# Patient Record
Sex: Female | Born: 1990 | Race: White | Hispanic: No | Marital: Single | Smoking: Never smoker
Health system: Southern US, Community
[De-identification: ages and names within clinical notes are randomized; demographics above are authoritative.]

## PROBLEM LIST (undated history)

## (undated) DIAGNOSIS — F419 Anxiety disorder, unspecified: Secondary | ICD-10-CM

## (undated) DIAGNOSIS — G43909 Migraine, unspecified, not intractable, without status migrainosus: Secondary | ICD-10-CM

## (undated) HISTORY — DX: Anxiety disorder, unspecified: F41.9

## (undated) HISTORY — DX: Migraine, unspecified, not intractable, without status migrainosus: G43.909

---

## 2011-08-14 ENCOUNTER — Emergency Department (HOSPITAL_COMMUNITY)
Admission: EM | Admit: 2011-08-14 | Discharge: 2011-08-14 | Disposition: A | Discharge status: Home / Self Care | Payer: 59 | Attending: Emergency Medicine | Admitting: Emergency Medicine

## 2011-08-14 ENCOUNTER — Encounter (HOSPITAL_COMMUNITY): Payer: Self-pay | Admitting: *Deleted

## 2011-08-14 DIAGNOSIS — H9201 Otalgia, right ear: Secondary | ICD-10-CM

## 2011-08-14 DIAGNOSIS — H6091 Unspecified otitis externa, right ear: Secondary | ICD-10-CM

## 2011-08-14 DIAGNOSIS — J029 Acute pharyngitis, unspecified: Secondary | ICD-10-CM

## 2011-08-14 DIAGNOSIS — R070 Pain in throat: Secondary | ICD-10-CM | POA: Insufficient documentation

## 2011-08-14 DIAGNOSIS — H9209 Otalgia, unspecified ear: Secondary | ICD-10-CM | POA: Insufficient documentation

## 2011-08-14 LAB — RAPID STREP SCREEN (MED CTR MEBANE ONLY): Streptococcus, Group A Screen (Direct): NEGATIVE

## 2011-08-14 MED ORDER — NEOMYCIN-POLYMYXIN-HC 3.5-10000-1 OT SOLN
4.0000 [drp] | OTIC | Status: AC
  Administered 2011-08-14: 4 [drp] via OTIC
  Filled 2011-08-14: qty 10

## 2011-08-14 MED ORDER — NAPROXEN 250 MG PO TABS: 250.0000 mg | ORAL_TABLET | Freq: Two times a day (BID) | ORAL | Status: DC

## 2011-08-14 MED ORDER — OXYCODONE-ACETAMINOPHEN 5-325 MG PO TABS
2.0000 | ORAL_TABLET | Freq: Once | ORAL | Status: AC
  Administered 2011-08-14: 2 via ORAL
  Filled 2011-08-14 (×2): qty 2

## 2011-08-14 MED ORDER — NEOMYCIN-POLYMYXIN-HC 3.5-10000-1 OT SUSP
OTIC | Status: AC
  Filled 2011-08-14: qty 10

## 2011-08-14 MED ORDER — NEOMYCIN-COLIST-HC-THONZONIUM 3.3-3-10-0.5 MG/ML OT SUSP: 4.0000 [drp] | Freq: Once | OTIC | Status: DC

## 2011-08-14 MED ORDER — OXYCODONE-ACETAMINOPHEN 5-325 MG PO TABS: ORAL_TABLET | ORAL | Status: AC

## 2011-08-14 NOTE — ED Provider Notes (Signed)
History     CSN: [id number]  Arrival date & time 08/14/11  0150   First MD Initiated Contact with Patient 08/14/11 0220      Chief Complaint  Patient presents with  . Otalgia  . Sore Throat    HPI Pt was seen at 0225.  Per pt, c/o gradual onset and persistence of constant sore throat x3 days, and right ear pain that began PTA.  Denies fevers, no cough, no SOB, no rash, no abd pain, no injury.   History reviewed. No pertinent past medical history.  History reviewed. No pertinent past surgical history.   History  Substance Use Topics  . Smoking status: Not on file  . Smokeless tobacco: Not on file  . Alcohol Use: No   Review of Systems ROS: Statement: All systems negative except as marked or noted in the HPI; Constitutional: Negative for fever and chills. ; ; Eyes: Negative for eye pain, redness and discharge. ; ; ENMT: Negative for hoarseness, nasal congestion, sinus pressure; +right ear pain, sore throat. ; ; Cardiovascular: Negative for chest pain, palpitations, diaphoresis, dyspnea and peripheral edema. ; ; Respiratory: Negative for cough, wheezing and stridor. ; ; Gastrointestinal: Negative for nausea, vomiting, diarrhea, abdominal pain, blood in stool, hematemesis, jaundice and rectal bleeding. . ; ; Genitourinary: Negative for dysuria, flank pain and hematuria. ; ; Musculoskeletal: Negative for back pain and neck pain. Negative for swelling and trauma.; ; Skin: Negative for pruritus, rash, abrasions, blisters, bruising and skin lesion.; ; Neuro: Negative for headache, lightheadedness and neck stiffness. Negative for weakness, altered level of consciousness , altered mental status, extremity weakness, paresthesias, involuntary movement, seizure and syncope.     Allergies  Erythromycin  Home Medications  No current outpatient prescriptions on file.  BP 134/79  Pulse 108  Temp(Src) 98.8 F (37.1 C) (Oral)  Resp 20  SpO2 99%  Physical Exam 0230: Physical examination:   Nursing notes reviewed; Vital signs and O2 SAT reviewed;  Constitutional: Well developed, Well nourished, Well hydrated, In no acute distress; Head:  Normocephalic, atraumatic; Eyes: EOMI, PERRL, No scleral icterus; ENMT: TM's clear bilat. +right external auditory canal with mild edema but no erythema, drainage, or wounds; +edemetous nasal turbinates bilat with clear rhinorrhea; Mouth and pharynx normal, Mucous membranes moist, no hoarse voice, no drooling, no stridor, speaking full sentences with ease; Neck: Supple, Full range of motion, No lymphadenopathy; Cardiovascular: Regular rate and rhythm, No murmur, rub, or gallop; Respiratory: Breath sounds clear & equal bilaterally, No rales, rhonchi, wheezes, or rub, Normal respiratory effort/excursion; Chest: Nontender, Movement normal; Extremities: Pulses normal, No tenderness, No edema, No calf edema or asymmetry.; Neuro: AA&Ox3, Major CN grossly intact.  No gross focal motor or sensory deficits in extremities.; Skin: Color normal, Warm, Dry, no rash.    ED Course  Procedures    MDM  MDM Reviewed: nursing note and vitals Interpretation: labs   Results for orders placed during the hospital encounter of 08/14/11  RAPID STREP SCREEN      Component Value Range   Streptococcus, Group A Screen (Direct) NEGATIVE  NEGATIVE      3:16 AM:  Dx testing d/w pt and friend.  Questions answered.  Verb understanding, agreeable to d/c home with outpt f/u.            Laray Anger, DO 08/16/11 [phone]

## 2011-08-14 NOTE — Discharge Instructions (Signed)
RESOURCE GUIDE  Dental Problems  Patients with Medicaid: [facility]                     [facility] [address]                                           [address] Phone:  [phone]                                                  Phone:  [phone]  If unable to pay or uninsured, contact:  [facility] or [facility] to become qualified for the adult dental clinic.  Chronic Pain Problems Contact [facility]  [phone] Patients need to be referred by their primary care doctor.  Insufficient Money for Medicine Contact [employer]:  call "211" or [facility] 271-5999.  No Primary Care Doctor Call [facility]  832-8000 Other agencies that provide inexpensive medical care    [facility]  [phone]    [facility]  [phone]    [facility]  [phone]    Women[facility] Clinic  [phone]    [facility]  [phone]    [facility]  [phone]  Psychological Services [facility]  [phone] [facility]  [phone] [facility]   [phone] (emergency services [phone])  Substance Abuse Resources [facility]  336-882-2125 [facility] 336-784-9470 [facility] 336-285-9073 [facility] 336-845-3988 Residential & Outpatient Substance Abuse Program  800-659-3381  Abuse/Neglect [place] Child Abuse Hotline (336) 641-3795 [place] Child Abuse Hotline 800-378-5315 (After Hours)  Emergency Shelter [facility] (336) 271-5985  Maternity Homes [facility] (336) 275-9566 [facility] (704) 372-4663  MRSA Hotline #:   832-7006    [place] [facility]  [facility] [place] [facility]     [employer]                          [facility] [address] [place]                       [address]      [address]  [place]                                                [place]                            [place] Phone:  [phone]                                   Phone:  [phone]                 Phone:  [phone]  [facility] Phone:  [phone]    [facility] Child Abuse Hotline (516)646-3614 5801785065 (After Hours)    Take over the counter sudafed, as directed on packaging, for the next week.  Use over the counter normal saline nasal spray, as instructed in the Emergency Department, several times per day for the next 2 weeks. Gargle with warm water several times per day to help with discomfort.  May also use over the counter sore throat pain medicines such as chloraseptic or sucrets, as directed on packaging, as needed for discomfort.  Use the cortisporin otic suspension:  4 drops in your right ear 3 times per day for the next 7 days.  Call your regular medical doctor today to schedule a follow up appointment this week.  Return to the Emergency Department immediately if worsening.

## 2011-08-14 NOTE — ED Notes (Signed)
Pt has had a sore throat for 3 days and had sudden onset of sharp right ear pain this pm.  Pt cant hear out of right ear since pain started

## 2011-10-24 ENCOUNTER — Encounter (HOSPITAL_COMMUNITY): Payer: Self-pay | Admitting: *Deleted

## 2011-10-24 ENCOUNTER — Emergency Department (HOSPITAL_COMMUNITY)
Admission: EM | Admit: 2011-10-24 | Discharge: 2011-10-24 | Disposition: A | Discharge status: Home / Self Care | Payer: Managed Care, Other (non HMO) | Attending: Emergency Medicine | Admitting: Emergency Medicine

## 2011-10-24 DIAGNOSIS — R110 Nausea: Secondary | ICD-10-CM | POA: Insufficient documentation

## 2011-10-24 DIAGNOSIS — N390 Urinary tract infection, site not specified: Secondary | ICD-10-CM

## 2011-10-24 DIAGNOSIS — R5381 Other malaise: Secondary | ICD-10-CM | POA: Insufficient documentation

## 2011-10-24 DIAGNOSIS — IMO0001 Reserved for inherently not codable concepts without codable children: Secondary | ICD-10-CM | POA: Insufficient documentation

## 2011-10-24 DIAGNOSIS — Z3202 Encounter for pregnancy test, result negative: Secondary | ICD-10-CM | POA: Insufficient documentation

## 2011-10-24 LAB — URINALYSIS, ROUTINE W REFLEX MICROSCOPIC
Bilirubin Urine: NEGATIVE
Glucose, UA: NEGATIVE mg/dL
Ketones, ur: 15 mg/dL — AB
pH: 6 (ref 5.0–8.0)

## 2011-10-24 LAB — URINE MICROSCOPIC-ADD ON

## 2011-10-24 MED ORDER — SULFAMETHOXAZOLE-TMP DS 800-160 MG PO TABS
1.0000 | ORAL_TABLET | Freq: Once | ORAL | Status: AC
  Administered 2011-10-24: 1 via ORAL
  Filled 2011-10-24: qty 1

## 2011-10-24 MED ORDER — SULFAMETHOXAZOLE-TMP DS 800-160 MG PO TABS: 1.0000 | ORAL_TABLET | Freq: Two times a day (BID) | ORAL | Status: AC

## 2011-10-24 NOTE — ED Notes (Signed)
Pt discharged via teach back. 

## 2011-10-24 NOTE — Discharge Instructions (Signed)
Urinary Tract Infection A urinary tract infection (UTI) is often caused by a germ (bacteria). A UTI is usually helped with medicine (antibiotics) that kills germs. Take all the medicine until it is gone. Do this even if you are feeling better. You are usually better in 7 to 10 days. HOME CARE   Drink enough water and fluids to keep your pee (urine) clear or pale yellow. Drink:   Cranberry juice.   Water.   Avoid:   Caffeine.   Tea.   Bubbly (carbonated) drinks.   Alcohol.   Only take medicine as told by your doctor.   To prevent further infections:   Pee often.   After pooping (bowel movement), women should wipe from front to back. Use each tissue only once.   Pee before and after having sex (intercourse).  Ask your doctor when your test results will be ready. Make sure you follow up and get your test results.  GET HELP RIGHT AWAY IF:   There is very bad back pain or lower belly (abdominal) pain.   You get the chills.   You have a fever.   Your baby is older than 3 months with a rectal temperature of 102 F (38.9 C) or higher.   Your baby is 70 months old or younger with a rectal temperature of 100.4 F (38 C) or higher.   You feel sick to your stomach (nauseous) or throw up (vomit).   There is continued burning with peeing.   Your problems are not better in 3 days. Return sooner if you are getting worse.  MAKE SURE YOU:   Understand these instructions.   Will watch your condition.   Will get help right away if you are not doing well or get worse.  Document Released: 10/20/2007 Document Revised: 04/22/2011 Document Reviewed: 10/20/2007 [facility] Patient Information 2012 [place], [place]. Her pregnancy test is negative

## 2011-10-24 NOTE — ED Notes (Signed)
nuasea for 1-2 months diarrhea no pain.  lmp 1-2 months ago

## 2011-10-24 NOTE — ED Notes (Signed)
Having nausea no emesis.

## 2011-10-24 NOTE — ED Provider Notes (Signed)
History     CSN: [id number]  Arrival date & time 10/24/11  1950   First MD Initiated Contact with Patient 10/24/11 2008      Chief Complaint  Patient presents with  . Nausea    (Consider location/radiation/quality/duration/timing/severity/associated sxs/prior treatment) HPI Comments: Patient states, that she has had intermittent episodes of nausea, myalgias, and fatigue.  For the past 2 months.  She has not had a period in 2 months.  She states that the last time.  She felt like that she was pregnant.  Home pregnancy test, "don't work for her."  She needs a blood test to determine pregnancy.  The history is provided by the patient.    History reviewed. No pertinent past medical history.  History reviewed. No pertinent past surgical history.  No family history on file.  History  Substance Use Topics  . Smoking status: Not on file  . Smokeless tobacco: Not on file  . Alcohol Use: No    OB History    Grav Para Term Preterm Abortions TAB SAB Ect Mult Living                  Review of Systems  Constitutional: Negative for fever and chills.  Gastrointestinal: Negative for abdominal pain.  Genitourinary: Negative for dysuria, vaginal bleeding, menstrual problem and pelvic pain.  Skin: Negative for pallor.  Neurological: Positive for dizziness and weakness.    Allergies  Erythromycin  Home Medications  No current outpatient prescriptions on file.  BP 119/78  Pulse 77  Temp(Src) 98.8 F (37.1 C) (Oral)  Resp 20  SpO2 99%  LMP 08/24/2011  Physical Exam  Constitutional: She is oriented to person, place, and time. She appears well-developed.  HENT:  Head: Normocephalic.  Eyes: Pupils are equal, round, and reactive to light.  Neck: Normal range of motion.  Cardiovascular: Normal rate.   Pulmonary/Chest: Effort normal.  Abdominal: She exhibits no distension. There is no tenderness.  Musculoskeletal: Normal range of motion.  Neurological: She is alert and  oriented to person, place, and time.  Skin: Skin is warm and dry. No pallor.    ED Course  Procedures (including critical care time)   Labs Reviewed  URINALYSIS, ROUTINE W REFLEX MICROSCOPIC  PREGNANCY, URINE  HCG, QUANTITATIVE, PREGNANCY   No results found.   No diagnosis found.    MDM  Will check beta hCG, quantitative, as well as urine, I suspect this is more a urinary tract, infection, and a pregnancy        Arman Filter, NP 10/24/11 2140  Arman Filter, NP 10/24/11 2140

## 2011-10-26 NOTE — ED Provider Notes (Signed)
Medical screening examination/treatment/procedure(s) were performed by non-physician practitioner and as supervising physician I was immediately available for consultation/collaboration.   Carleene Cooper III, MD 10/26/11 [phone]

## 2012-01-13 LAB — HM PAP SMEAR: HM Pap smear: NORMAL

## 2012-02-17 ENCOUNTER — Emergency Department (HOSPITAL_BASED_OUTPATIENT_CLINIC_OR_DEPARTMENT_OTHER): Payer: Managed Care, Other (non HMO)

## 2012-02-17 ENCOUNTER — Encounter (HOSPITAL_BASED_OUTPATIENT_CLINIC_OR_DEPARTMENT_OTHER): Payer: Self-pay | Admitting: Emergency Medicine

## 2012-02-17 ENCOUNTER — Emergency Department (HOSPITAL_BASED_OUTPATIENT_CLINIC_OR_DEPARTMENT_OTHER)
Admission: EM | Admit: 2012-02-17 | Discharge: 2012-02-17 | Disposition: A | Discharge status: Home / Self Care | Payer: Managed Care, Other (non HMO) | Attending: Emergency Medicine | Admitting: Emergency Medicine

## 2012-02-17 DIAGNOSIS — R109 Unspecified abdominal pain: Secondary | ICD-10-CM | POA: Insufficient documentation

## 2012-02-17 DIAGNOSIS — R110 Nausea: Secondary | ICD-10-CM | POA: Insufficient documentation

## 2012-02-17 DIAGNOSIS — R300 Dysuria: Secondary | ICD-10-CM | POA: Insufficient documentation

## 2012-02-17 DIAGNOSIS — M549 Dorsalgia, unspecified: Secondary | ICD-10-CM | POA: Insufficient documentation

## 2012-02-17 DIAGNOSIS — N390 Urinary tract infection, site not specified: Secondary | ICD-10-CM | POA: Insufficient documentation

## 2012-02-17 LAB — URINALYSIS, ROUTINE W REFLEX MICROSCOPIC
Ketones, ur: NEGATIVE mg/dL
Nitrite: POSITIVE — AB
Protein, ur: 100 mg/dL — AB
Urobilinogen, UA: 1 mg/dL (ref 0.0–1.0)

## 2012-02-17 LAB — CBC
Hemoglobin: 12.7 g/dL (ref 12.0–15.0)
MCH: 30.3 pg (ref 26.0–34.0)
MCHC: 34.3 g/dL (ref 30.0–36.0)
Platelets: 224 10*3/uL (ref 150–400)

## 2012-02-17 LAB — URINE MICROSCOPIC-ADD ON

## 2012-02-17 MED ORDER — CIPROFLOXACIN HCL 500 MG PO TABS: 500.0000 mg | ORAL_TABLET | Freq: Two times a day (BID) | ORAL | Status: DC

## 2012-02-17 MED ORDER — OXYCODONE-ACETAMINOPHEN 5-325 MG PO TABS
1.0000 | ORAL_TABLET | Freq: Once | ORAL | Status: AC
  Administered 2012-02-17: 1 via ORAL
  Filled 2012-02-17 (×2): qty 1

## 2012-02-17 MED ORDER — ONDANSETRON 4 MG PO TBDP
4.0000 mg | ORAL_TABLET | Freq: Once | ORAL | Status: AC
  Administered 2012-02-17: 4 mg via ORAL
  Filled 2012-02-17: qty 1

## 2012-02-17 NOTE — ED Notes (Signed)
Low right sided abd pain radiating to right low back and pain with urination since 0600 this morning.

## 2012-02-17 NOTE — ED Provider Notes (Signed)
History     CSN: [id number]  Arrival date & time 02/17/12  1632   First MD Initiated Contact with Patient 02/17/12 1642      Chief Complaint  Patient presents with  . Abdominal Pain  . Dysuria    (Consider location/radiation/quality/duration/timing/severity/associated sxs/prior treatment) HPI Comments: 21 year old female presents the emergency department complaining of sudden onset right sided lower abdominal pain waking her from sleep at 6:00 this morning. The pain radiates around her side to her back. Describes pain as sharp and stabbing rated 8/10. She tried taking Tylenol without any relief. Admits to associated dysuria. Nausea began to develop upon arrival to the ED. Denies hematuria, vaginal bleeding or discharge, fever or chills. States she is perfectly fine and asymptomatic yesterday. Denies any history of kidney stones. Admits to not drinking lots of fluids on a regular basis.  Patient is a 21 y.o. female presenting with abdominal pain and dysuria. The history is provided by the patient and a significant other.  Abdominal Pain The primary symptoms of the illness include abdominal pain, nausea and dysuria. The primary symptoms of the illness do not include fever, shortness of breath, vomiting, vaginal discharge or vaginal bleeding.  The dysuria is not associated with hematuria.  Additional symptoms associated with the illness include back pain. Symptoms associated with the illness do not include chills or hematuria.  Dysuria  Associated symptoms include nausea and flank pain. Pertinent negatives include no chills, no vomiting and no hematuria.    History reviewed. No pertinent past medical history.  History reviewed. No pertinent past surgical history.  No family history on file.  History  Substance Use Topics  . Smoking status: Never Smoker   . Smokeless tobacco: Not on file  . Alcohol Use: No    OB History    Grav Para Term Preterm Abortions TAB SAB Ect Mult Living                    Review of Systems  Constitutional: Negative for fever and chills.  HENT: Negative for neck pain and neck stiffness.   Respiratory: Negative for shortness of breath.   Cardiovascular: Negative for chest pain.  Gastrointestinal: Positive for nausea and abdominal pain. Negative for vomiting.  Genitourinary: Positive for dysuria and flank pain. Negative for hematuria, vaginal bleeding, vaginal discharge and pelvic pain.  Musculoskeletal: Positive for back pain.  Skin: Negative for rash.  Neurological: Negative for dizziness and light-headedness.  Psychiatric/Behavioral: The patient is not nervous/anxious.     Allergies  Erythromycin  Home Medications  No current outpatient prescriptions on file.  BP 136/89  Pulse 92  Temp 99 F (37.2 C) (Oral)  Resp 16  Ht 5\' 7"  (1.702 m)  Wt 150 lb (68.04 kg)  BMI 23.49 kg/m2  SpO2 98%  LMP 12/10/2011  Physical Exam  Nursing note and vitals reviewed. Constitutional: She is oriented to person, place, and time. She appears well-developed and well-nourished. No distress.  HENT:  Head: Normocephalic and atraumatic.  Mouth/Throat: Oropharynx is clear and moist.  Eyes: Conjunctivae normal are normal.  Neck: Normal range of motion. Neck supple.  Cardiovascular: Normal rate, regular rhythm and normal heart sounds.   Pulmonary/Chest: Effort normal and breath sounds normal. No respiratory distress.  Abdominal: Normal appearance and bowel sounds are normal. There is tenderness in the right lower quadrant and suprapubic area. There is CVA tenderness (on right). There is no rigidity, no rebound and no guarding.  Musculoskeletal: Normal range of motion.  Neurological:  She is alert and oriented to person, place, and time.  Skin: Skin is warm and dry. No rash noted. She is not diaphoretic.  Psychiatric: She has a normal mood and affect. Her behavior is normal.    ED Course  Procedures (including critical care time)   Labs  Reviewed  URINALYSIS, ROUTINE W REFLEX MICROSCOPIC  PREGNANCY, URINE   Results for orders placed during the hospital encounter of 02/17/12  URINALYSIS, ROUTINE W REFLEX MICROSCOPIC      Component Value Range   Color, Urine YELLOW  YELLOW   APPearance TURBID (*) CLEAR   Specific Gravity, Urine 1.015  1.005 - 1.030   pH 7.0  5.0 - 8.0   Glucose, UA NEGATIVE  NEGATIVE mg/dL   Hgb urine dipstick LARGE (*) NEGATIVE   Bilirubin Urine NEGATIVE  NEGATIVE   Ketones, ur NEGATIVE  NEGATIVE mg/dL   Protein, ur 161 (*) NEGATIVE mg/dL   Urobilinogen, UA 1.0  0.0 - 1.0 mg/dL   Nitrite POSITIVE (*) NEGATIVE   Leukocytes, UA LARGE (*) NEGATIVE  PREGNANCY, URINE      Component Value Range   Preg Test, Ur NEGATIVE  NEGATIVE  CBC      Component Value Range   WBC 16.6 (*) 4.0 - 10.5 K/uL   RBC 4.19  3.87 - 5.11 MIL/uL   Hemoglobin 12.7  12.0 - 15.0 g/dL   HCT 09.6  04.5 - 40.9 %   MCV 88.3  78.0 - 100.0 fL   MCH 30.3  26.0 - 34.0 pg   MCHC 34.3  30.0 - 36.0 g/dL   RDW 81.1  91.4 - 78.2 %   Platelets 224  150 - 400 K/uL  URINE MICROSCOPIC-ADD ON      Component Value Range   Squamous Epithelial / LPF FEW (*) RARE   WBC, UA TOO NUMEROUS TO COUNT  <3 WBC/hpf   RBC / HPF 7-10  <3 RBC/hpf   Bacteria, UA MANY (*) RARE    Ct Abdomen Pelvis Wo Contrast  02/17/2012  *RADIOLOGY REPORT*  Clinical Data: Bilateral flank pain and nausea.  CT ABDOMEN AND PELVIS WITHOUT CONTRAST  Technique:  Multidetector CT imaging of the abdomen and pelvis was performed following the standard protocol without intravenous contrast.  Comparison: None.  Findings: No focal abnormalities seen in the liver or spleen on this study performed without intravenous contrast material. The stomach, duodenum, pancreas, gallbladder, adrenal glands, and kidneys have normal imaging features.  Specifically, there is no evidence for renal stones.  No secondary changes in either kidney. No ureteral or bladder stones.  No abdominal aortic aneurysm.   There is no free fluid or lymphadenopathy in the abdomen.  The abdominal bowel loops are unremarkable.  Imaging through the pelvis shows no free intraperitoneal fluid. There is no pelvic sidewall lymphadenopathy.  Uterus is unremarkable.  No adnexal mass. 2.4 cm follicle noted in the right ovary.  No substantial diverticular change in the colon.  The terminal ileum is normal.  The appendix is normal.  Bone windows reveal no worrisome lytic or sclerotic osseous lesions.  IMPRESSION: No acute findings in the abdomen or pelvis.   Original Report Authenticated By: ERIC A. MANSELL, M.D.      1. Urinary tract infection       MDM  21 y/o with UTI. I will prescribe cipro. Culture ordered. Close return precautions discussed.        Trevor Mace, PA-C 02/17/12 1823

## 2012-02-18 NOTE — ED Provider Notes (Signed)
Medical screening examination/treatment/procedure(s) were performed by non-physician practitioner and as supervising physician I was immediately available for consultation/collaboration.  Ryota Treece, MD 02/18/12 1944 

## 2012-02-19 LAB — URINE CULTURE

## 2012-02-20 NOTE — ED Notes (Signed)
+  Urine. Patient treated with Cipro. Sensitive to same. Per protocol MD. °

## 2012-05-31 ENCOUNTER — Emergency Department (HOSPITAL_BASED_OUTPATIENT_CLINIC_OR_DEPARTMENT_OTHER)
Admission: EM | Admit: 2012-05-31 | Discharge: 2012-05-31 | Disposition: A | Discharge status: Home / Self Care | Payer: Managed Care, Other (non HMO) | Attending: Emergency Medicine | Admitting: Emergency Medicine

## 2012-05-31 ENCOUNTER — Encounter (HOSPITAL_BASED_OUTPATIENT_CLINIC_OR_DEPARTMENT_OTHER): Payer: Self-pay | Admitting: *Deleted

## 2012-05-31 DIAGNOSIS — J40 Bronchitis, not specified as acute or chronic: Secondary | ICD-10-CM

## 2012-05-31 DIAGNOSIS — J209 Acute bronchitis, unspecified: Secondary | ICD-10-CM | POA: Insufficient documentation

## 2012-05-31 DIAGNOSIS — R062 Wheezing: Secondary | ICD-10-CM | POA: Insufficient documentation

## 2012-05-31 MED ORDER — AZITHROMYCIN 250 MG PO TABS: 250.0000 mg | ORAL_TABLET | Freq: Every day | ORAL | Status: DC

## 2012-05-31 MED ORDER — DEXAMETHASONE SODIUM PHOSPHATE 10 MG/ML IJ SOLN
10.0000 mg | Freq: Once | INTRAMUSCULAR | Status: AC
  Administered 2012-05-31: 10 mg via INTRAMUSCULAR
  Filled 2012-05-31: qty 1

## 2012-05-31 MED ORDER — HYDROCOD POLST-CHLORPHEN POLST 10-8 MG/5ML PO LQCR: 5.0000 mL | Freq: Every evening | ORAL | Status: DC | PRN

## 2012-05-31 NOTE — ED Notes (Signed)
Pt c/o cough x 4 days 

## 2012-05-31 NOTE — ED Provider Notes (Signed)
History     CSN: [id number]  Arrival date & time 05/31/12  2211   First MD Initiated Contact with Patient 05/31/12 2219      Chief Complaint  Patient presents with  . Cough     HPI Patient presents with four-day history of nonproductive cough and wheezing.  No fever chills or myalgias.  Patient is not a smoker.  No history of asthma.  No history of COPD.     History reviewed. No pertinent past medical history.  History reviewed. No pertinent past surgical history.  History reviewed. No pertinent family history.  History  Substance Use Topics  . Smoking status: Never Smoker   . Smokeless tobacco: Not on file  . Alcohol Use: No    OB History    Grav Para Term Preterm Abortions TAB SAB Ect Mult Living                  Review of Systems All other systems reviewed and are negative Allergies  Erythromycin  Home Medications   Current Outpatient Rx  Name  Route  Sig  Dispense  Refill  . AZITHROMYCIN 250 MG PO TABS   Oral   Take 1 tablet (250 mg total) by mouth daily. Take first 2 tablets together, then 1 every day until finished.   6 tablet   0   . HYDROCOD POLST-CPM POLST ER 10-8 MG/5ML PO LQCR   Oral   Take 5 mLs by mouth at bedtime as needed.   140 mL   0   . CIPROFLOXACIN HCL 500 MG PO TABS   Oral   Take 1 tablet (500 mg total) by mouth 2 (two) times daily.   20 tablet   0     BP 132/94  Pulse 93  Temp 98.9 F (37.2 C) (Oral)  Resp 18  Ht 5\' 7"  (1.702 m)  Wt 200 lb (90.719 kg)  BMI 31.32 kg/m2  SpO2 97%  Physical Exam  Nursing note and vitals reviewed. Constitutional: She is oriented to person, place, and time. She appears well-developed and well-nourished. No distress.  HENT:  Head: Normocephalic and atraumatic.  Eyes: Pupils are equal, round, and reactive to light.  Neck: Normal range of motion.  Cardiovascular: Normal rate and intact distal pulses.   Pulmonary/Chest: No respiratory distress. She has wheezes (Mild expiratory  throughout all lung fields).  Abdominal: Normal appearance. She exhibits no distension.  Musculoskeletal: Normal range of motion.  Neurological: She is alert and oriented to person, place, and time. No cranial nerve deficit.  Skin: Skin is warm and dry. No rash noted.  Psychiatric: She has a normal mood and affect. Her behavior is normal.    ED Course  Procedures (including critical care time)  Medications  chlorpheniramine-HYDROcodone (TUSSIONEX PENNKINETIC ER) 10-8 MG/5ML LQCR (not administered)  azithromycin (ZITHROMAX) 250 MG tablet (not administered)  dexamethasone (DECADRON) injection 10 mg (10 mg Intramuscular Given 05/31/12 2243)    Labs Reviewed - No data to display No results found.   1. Bronchitis       MDM          Nelia Shi, MD 05/31/12 [phone]

## 2012-06-07 ENCOUNTER — Ambulatory Visit: Payer: Managed Care, Other (non HMO) | Admitting: Internal Medicine

## 2012-06-23 ENCOUNTER — Encounter: Payer: Self-pay | Admitting: Internal Medicine

## 2012-06-23 ENCOUNTER — Ambulatory Visit (INDEPENDENT_AMBULATORY_CARE_PROVIDER_SITE_OTHER): Payer: Managed Care, Other (non HMO) | Admitting: Internal Medicine

## 2012-06-23 ENCOUNTER — Other Ambulatory Visit (INDEPENDENT_AMBULATORY_CARE_PROVIDER_SITE_OTHER): Payer: Managed Care, Other (non HMO)

## 2012-06-23 VITALS — BP 114/70 | HR 82 | Temp 98.3°F | Resp 16 | Ht 67.0 in | Wt 281.5 lb

## 2012-06-23 DIAGNOSIS — Z0000 Encounter for general adult medical examination without abnormal findings: Secondary | ICD-10-CM

## 2012-06-23 LAB — CBC WITH DIFFERENTIAL/PLATELET
Basophils Absolute: 0 10*3/uL (ref 0.0–0.1)
Eosinophils Absolute: 0.1 10*3/uL (ref 0.0–0.7)
HCT: 38.4 % (ref 36.0–46.0)
Lymphs Abs: 1.8 10*3/uL (ref 0.7–4.0)
MCHC: 34.1 g/dL (ref 30.0–36.0)
MCV: 86.3 fl (ref 78.0–100.0)
Monocytes Absolute: 0.5 10*3/uL (ref 0.1–1.0)
Platelets: 248 10*3/uL (ref 150.0–400.0)
RDW: 12.8 % (ref 11.5–14.6)

## 2012-06-23 LAB — COMPREHENSIVE METABOLIC PANEL
ALT: 20 U/L (ref 0–35)
AST: 16 U/L (ref 0–37)
Alkaline Phosphatase: 66 U/L (ref 39–117)
CO2: 25 mEq/L (ref 19–32)
GFR: 138.43 mL/min (ref >60.00)
Sodium: 138 mEq/L (ref 135–145)
Total Bilirubin: 0.6 mg/dL (ref 0.3–1.2)
Total Protein: 7.5 g/dL (ref 6.0–8.3)

## 2012-06-23 LAB — LIPID PANEL
HDL: 34.3 mg/dL — ABNORMAL LOW (ref >39.00)
Total CHOL/HDL Ratio: 5

## 2012-06-23 LAB — HEMOGLOBIN A1C: Hgb A1c MFr Bld: 5.2 % (ref 4.6–6.5)

## 2012-06-23 NOTE — Progress Notes (Signed)
  Subjective:    Patient ID: Wanda Gill, female    DOB: November 01, 1990, 22 y.o.   MRN: [id number]  HPI  New to me for a complete physical - she feels well and offers no complaints.  Review of Systems  Constitutional: Negative.   HENT: Negative.   Eyes: Negative.   Respiratory: Negative.   Cardiovascular: Negative.   Gastrointestinal: Negative.   Genitourinary: Negative.   Musculoskeletal: Negative.   Skin: Negative.   Neurological: Negative.   Hematological: Negative.   Psychiatric/Behavioral: Negative.        Objective:   Physical Exam  Vitals reviewed. Constitutional: She is oriented to person, place, and time. She appears well-developed and well-nourished. No distress.  HENT:  Head: Normocephalic and atraumatic.  Mouth/Throat: Oropharynx is clear and moist. No oropharyngeal exudate.  Eyes: Conjunctivae normal are normal. Right eye exhibits no discharge. Left eye exhibits no discharge. No scleral icterus.  Neck: Normal range of motion. Neck supple. No JVD present. No tracheal deviation present. No thyromegaly present.  Cardiovascular: Normal rate, regular rhythm, normal heart sounds and intact distal pulses.  Exam reveals no gallop and no friction rub.   No murmur heard. Pulmonary/Chest: Effort normal and breath sounds normal. No stridor. No respiratory distress. She has no wheezes. She has no rales. She exhibits no tenderness.  Abdominal: Soft. Bowel sounds are normal. She exhibits no distension and no mass. There is no tenderness. There is no rebound and no guarding.  Musculoskeletal: Normal range of motion. She exhibits no edema and no tenderness.  Lymphadenopathy:    She has no cervical adenopathy.  Neurological: She is oriented to person, place, and time.  Skin: Skin is warm and dry. No rash noted. She is not diaphoretic. No erythema. No pallor.  Psychiatric: She has a normal mood and affect. Her behavior is normal. Judgment and thought content normal.           Assessment & Plan:

## 2012-06-23 NOTE — Assessment & Plan Note (Signed)
Exam done Vaccines were reviewed - she refused to get a flu vax Labs ordered Pt ed material was given

## 2012-06-23 NOTE — Patient Instructions (Signed)
Preventive Care for Adults, Female A healthy lifestyle and preventive care can promote health and wellness. Preventive health guidelines for women include the following key practices.  A routine yearly physical is a good way to check with your caregiver about your health and preventive screening. It is a chance to share any concerns and updates on your health, and to receive a thorough exam.  Visit your dentist for a routine exam and preventive care every 6 months. Brush your teeth twice a day and floss once a day. Good oral hygiene prevents tooth decay and gum disease.  The frequency of eye exams is based on your age, health, family medical history, use of contact lenses, and other factors. Follow your caregiver's recommendations for frequency of eye exams.  Eat a healthy diet. Foods like vegetables, fruits, whole grains, low-fat dairy products, and lean protein foods contain the nutrients you need without too many calories. Decrease your intake of foods high in solid fats, added sugars, and salt. Eat the right amount of calories for you.Get information about a proper diet from your caregiver, if necessary.  Regular physical exercise is one of the most important things you can do for your health. Most adults should get at least 150 minutes of moderate-intensity exercise (any activity that increases your heart rate and causes you to sweat) each week. In addition, most adults need muscle-strengthening exercises on 2 or more days a week.  Maintain a healthy weight. The body mass index (BMI) is a screening tool to identify possible weight problems. It provides an estimate of body fat based on height and weight. Your caregiver can help determine your BMI, and can help you achieve or maintain a healthy weight.For adults 20 years and older:  A BMI below 18.5 is considered underweight.  A BMI of 18.5 to 24.9 is normal.  A BMI of 25 to 29.9 is considered overweight.  A BMI of 30 and above is  considered obese.  Maintain normal blood lipids and cholesterol levels by exercising and minimizing your intake of saturated fat. Eat a balanced diet with plenty of fruit and vegetables. Blood tests for lipids and cholesterol should begin at age 20 and be repeated every 5 years. If your lipid or cholesterol levels are high, you are over 50, or you are at high risk for heart disease, you may need your cholesterol levels checked more frequently.Ongoing high lipid and cholesterol levels should be treated with medicines if diet and exercise are not effective.  If you smoke, find out from your caregiver how to quit. If you do not use tobacco, do not start.  If you are pregnant, do not drink alcohol. If you are breastfeeding, be very cautious about drinking alcohol. If you are not pregnant and choose to drink alcohol, do not exceed 1 drink per day. One drink is considered to be 12 ounces (355 mL) of beer, 5 ounces (148 mL) of wine, or 1.5 ounces (44 mL) of liquor.  Avoid use of street drugs. Do not share needles with anyone. Ask for help if you need support or instructions about stopping the use of drugs.  High blood pressure causes heart disease and increases the risk of stroke. Your blood pressure should be checked at least every 1 to 2 years. Ongoing high blood pressure should be treated with medicines if weight loss and exercise are not effective.  If you are 55 to 24 years old, ask your caregiver if you should take aspirin to prevent strokes.  Diabetes   screening involves taking a blood sample to check your fasting blood sugar level. This should be done once every 3 years, after age 45, if you are within normal weight and without risk factors for diabetes. Testing should be considered at a younger age or be carried out more frequently if you are overweight and have at least 1 risk factor for diabetes.  Breast cancer screening is essential preventive care for women. You should practice "breast  self-awareness." This means understanding the normal appearance and feel of your breasts and may include breast self-examination. Any changes detected, no matter how small, should be reported to a caregiver. Women in their 20s and 30s should have a clinical breast exam (CBE) by a caregiver as part of a regular health exam every 1 to 3 years. After age 40, women should have a CBE every year. Starting at age 40, women should consider having a mammography (breast X-ray test) every year. Women who have a family history of breast cancer should talk to their caregiver about genetic screening. Women at a high risk of breast cancer should talk to their caregivers about having magnetic resonance imaging (MRI) and a mammography every year.  The Pap test is a screening test for cervical cancer. A Pap test can show cell changes on the cervix that might become cervical cancer if left untreated. A Pap test is a procedure in which cells are obtained and examined from the lower end of the uterus (cervix).  Women should have a Pap test starting at age 21.  Between ages 21 and 29, Pap tests should be repeated every 2 years.  Beginning at age 30, you should have a Pap test every 3 years as long as the past 3 Pap tests have been normal.  Some women have medical problems that increase the chance of getting cervical cancer. Talk to your caregiver about these problems. It is especially important to talk to your caregiver if a new problem develops soon after your last Pap test. In these cases, your caregiver may recommend more frequent screening and Pap tests.  The above recommendations are the same for women who have or have not gotten the vaccine for human papillomavirus (HPV).  If you had a hysterectomy for a problem that was not cancer or a condition that could lead to cancer, then you no longer need Pap tests. Even if you no longer need a Pap test, a regular exam is a good idea to make sure no other problems are  starting.  If you are between ages 65 and 70, and you have had normal Pap tests going back 10 years, you no longer need Pap tests. Even if you no longer need a Pap test, a regular exam is a good idea to make sure no other problems are starting.  If you have had past treatment for cervical cancer or a condition that could lead to cancer, you need Pap tests and screening for cancer for at least 20 years after your treatment.  If Pap tests have been discontinued, risk factors (such as a new sexual partner) need to be reassessed to determine if screening should be resumed.  The HPV test is an additional test that may be used for cervical cancer screening. The HPV test looks for the virus that can cause the cell changes on the cervix. The cells collected during the Pap test can be tested for HPV. The HPV test could be used to screen women aged 30 years and older, and should   be used in women of any age who have unclear Pap test results. After the age of 30, women should have HPV testing at the same frequency as a Pap test.  Colorectal cancer can be detected and often prevented. Most routine colorectal cancer screening begins at the age of 50 and continues through age 75. However, your caregiver may recommend screening at an earlier age if you have risk factors for colon cancer. On a yearly basis, your caregiver may provide home test kits to check for hidden blood in the stool. Use of a small camera at the end of a tube, to directly examine the colon (sigmoidoscopy or colonoscopy), can detect the earliest forms of colorectal cancer. Talk to your caregiver about this at age 50, when routine screening begins. Direct examination of the colon should be repeated every 5 to 10 years through age 75, unless early forms of pre-cancerous polyps or small growths are found.  Hepatitis C blood testing is recommended for all people born from 1945 through 1965 and any individual with known risks for hepatitis C.  Practice  safe sex. Use condoms and avoid high-risk sexual practices to reduce the spread of sexually transmitted infections (STIs). STIs include gonorrhea, chlamydia, syphilis, trichomonas, herpes, HPV, and human immunodeficiency virus (HIV). Herpes, HIV, and HPV are viral illnesses that have no cure. They can result in disability, cancer, and death. Sexually active women aged 25 and younger should be checked for chlamydia. Older women with new or multiple partners should also be tested for chlamydia. Testing for other STIs is recommended if you are sexually active and at increased risk.  Osteoporosis is a disease in which the bones lose minerals and strength with aging. This can result in serious bone fractures. The risk of osteoporosis can be identified using a bone density scan. Women ages 65 and over and women at risk for fractures or osteoporosis should discuss screening with their caregivers. Ask your caregiver whether you should take a calcium supplement or vitamin D to reduce the rate of osteoporosis.  Menopause can be associated with physical symptoms and risks. Hormone replacement therapy is available to decrease symptoms and risks. You should talk to your caregiver about whether hormone replacement therapy is right for you.  Use sunscreen with sun protection factor (SPF) of 30 or more. Apply sunscreen liberally and repeatedly throughout the day. You should seek shade when your shadow is shorter than you. Protect yourself by wearing long sleeves, pants, a wide-brimmed hat, and sunglasses year round, whenever you are outdoors.  Once a month, do a whole body skin exam, using a mirror to look at the skin on your back. Notify your caregiver of new moles, moles that have irregular borders, moles that are larger than a pencil eraser, or moles that have changed in shape or color.  Stay current with required immunizations.  Influenza. You need a dose every fall (or winter). The composition of the flu vaccine  changes each year, so being vaccinated once is not enough.  Pneumococcal polysaccharide. You need 1 to 2 doses if you smoke cigarettes or if you have certain chronic medical conditions. You need 1 dose at age 65 (or older) if you have never been vaccinated.  Tetanus, diphtheria, pertussis (Tdap, Td). Get 1 dose of Tdap vaccine if you are younger than age 65, are over 65 and have contact with an infant, are a healthcare worker, are pregnant, or simply want to be protected from whooping cough. After that, you need a Td   booster dose every 10 years. Consult your caregiver if you have not had at least 3 tetanus and diphtheria-containing shots sometime in your life or have a deep or dirty wound.  HPV. You need this vaccine if you are a woman age 26 or younger. The vaccine is given in 3 doses over 6 months.  Measles, mumps, rubella (MMR). You need at least 1 dose of MMR if you were born in 1957 or later. You may also need a second dose.  Meningococcal. If you are age 19 to 21 and a first-year college student living in a residence hall, or have one of several medical conditions, you need to get vaccinated against meningococcal disease. You may also need additional booster doses.  Zoster (shingles). If you are age 60 or older, you should get this vaccine.  Varicella (chickenpox). If you have never had chickenpox or you were vaccinated but received only 1 dose, talk to your caregiver to find out if you need this vaccine.  Hepatitis A. You need this vaccine if you have a specific risk factor for hepatitis A virus infection or you simply wish to be protected from this disease. The vaccine is usually given as 2 doses, 6 to 18 months apart.  Hepatitis B. You need this vaccine if you have a specific risk factor for hepatitis B virus infection or you simply wish to be protected from this disease. The vaccine is given in 3 doses, usually over 6 months. Preventive Services / Frequency Ages 19 to 39  Blood  pressure check.** / Every 1 to 2 years.  Lipid and cholesterol check.** / Every 5 years beginning at age 20.  Clinical breast exam.** / Every 3 years for women in their 20s and 30s.  Pap test.** / Every 2 years from ages 21 through 29. Every 3 years starting at age 30 through age 65 or 70 with a history of 3 consecutive normal Pap tests.  HPV screening.** / Every 3 years from ages 30 through ages 65 to 70 with a history of 3 consecutive normal Pap tests.  Hepatitis C blood test.** / For any individual with known risks for hepatitis C.  Skin self-exam. / Monthly.  Influenza immunization.** / Every year.  Pneumococcal polysaccharide immunization.** / 1 to 2 doses if you smoke cigarettes or if you have certain chronic medical conditions.  Tetanus, diphtheria, pertussis (Tdap, Td) immunization. / A one-time dose of Tdap vaccine. After that, you need a Td booster dose every 10 years.  HPV immunization. / 3 doses over 6 months, if you are 26 and younger.  Measles, mumps, rubella (MMR) immunization. / You need at least 1 dose of MMR if you were born in 1957 or later. You may also need a second dose.  Meningococcal immunization. / 1 dose if you are age 19 to 21 and a first-year college student living in a residence hall, or have one of several medical conditions, you need to get vaccinated against meningococcal disease. You may also need additional booster doses.  Varicella immunization.** / Consult your caregiver.  Hepatitis A immunization.** / Consult your caregiver. 2 doses, 6 to 18 months apart.  Hepatitis B immunization.** / Consult your caregiver. 3 doses usually over 6 months. Ages 40 to 64  Blood pressure check.** / Every 1 to 2 years.  Lipid and cholesterol check.** / Every 5 years beginning at age 20.  Clinical breast exam.** / Every year after age 40.  Mammogram.** / Every year beginning at age 40   and continuing for as long as you are in good health. Consult with your  caregiver.  Pap test.** / Every 3 years starting at age 30 through age 65 or 70 with a history of 3 consecutive normal Pap tests.  HPV screening.** / Every 3 years from ages 30 through ages 65 to 70 with a history of 3 consecutive normal Pap tests.  Fecal occult blood test (FOBT) of stool. / Every year beginning at age 50 and continuing until age 75. You may not need to do this test if you get a colonoscopy every 10 years.  Flexible sigmoidoscopy or colonoscopy.** / Every 5 years for a flexible sigmoidoscopy or every 10 years for a colonoscopy beginning at age 50 and continuing until age 75.  Hepatitis C blood test.** / For all people born from 1945 through 1965 and any individual with known risks for hepatitis C.  Skin self-exam. / Monthly.  Influenza immunization.** / Every year.  Pneumococcal polysaccharide immunization.** / 1 to 2 doses if you smoke cigarettes or if you have certain chronic medical conditions.  Tetanus, diphtheria, pertussis (Tdap, Td) immunization.** / A one-time dose of Tdap vaccine. After that, you need a Td booster dose every 10 years.  Measles, mumps, rubella (MMR) immunization. / You need at least 1 dose of MMR if you were born in 1957 or later. You may also need a second dose.  Varicella immunization.** / Consult your caregiver.  Meningococcal immunization.** / Consult your caregiver.  Hepatitis A immunization.** / Consult your caregiver. 2 doses, 6 to 18 months apart.  Hepatitis B immunization.** / Consult your caregiver. 3 doses, usually over 6 months. Ages 65 and over  Blood pressure check.** / Every 1 to 2 years.  Lipid and cholesterol check.** / Every 5 years beginning at age 20.  Clinical breast exam.** / Every year after age 40.  Mammogram.** / Every year beginning at age 40 and continuing for as long as you are in good health. Consult with your caregiver.  Pap test.** / Every 3 years starting at age 30 through age 65 or 70 with a 3  consecutive normal Pap tests. Testing can be stopped between 65 and 70 with 3 consecutive normal Pap tests and no abnormal Pap or HPV tests in the past 10 years.  HPV screening.** / Every 3 years from ages 30 through ages 65 or 70 with a history of 3 consecutive normal Pap tests. Testing can be stopped between 65 and 70 with 3 consecutive normal Pap tests and no abnormal Pap or HPV tests in the past 10 years.  Fecal occult blood test (FOBT) of stool. / Every year beginning at age 50 and continuing until age 75. You may not need to do this test if you get a colonoscopy every 10 years.  Flexible sigmoidoscopy or colonoscopy.** / Every 5 years for a flexible sigmoidoscopy or every 10 years for a colonoscopy beginning at age 50 and continuing until age 75.  Hepatitis C blood test.** / For all people born from 1945 through 1965 and any individual with known risks for hepatitis C.  Osteoporosis screening.** / A one-time screening for women ages 65 and over and women at risk for fractures or osteoporosis.  Skin self-exam. / Monthly.  Influenza immunization.** / Every year.  Pneumococcal polysaccharide immunization.** / 1 dose at age 65 (or older) if you have never been vaccinated.  Tetanus, diphtheria, pertussis (Tdap, Td) immunization. / A one-time dose of Tdap vaccine if you are over   65 and have contact with an infant, are a healthcare worker, or simply want to be protected from whooping cough. After that, you need a Td booster dose every 10 years.  Varicella immunization.** / Consult your caregiver.  Meningococcal immunization.** / Consult your caregiver.  Hepatitis A immunization.** / Consult your caregiver. 2 doses, 6 to 18 months apart.  Hepatitis B immunization.** / Check with your caregiver. 3 doses, usually over 6 months. ** Family history and personal history of risk and conditions may change your caregiver's recommendations. Document Released: 06/29/2001 Document Revised: 07/26/2011  Document Reviewed: 09/28/2010 [employer] Patient Information 2013 [employer].  

## 2012-06-23 NOTE — Assessment & Plan Note (Addendum)
I will check her labs today to see if she has any secondary cause or end organ damage

## 2013-02-20 IMAGING — CT CT ABD-PELV W/O CM
2 of 4 series · 16 of 46 positions shown, 18 images · non-contrast
Comparison: None.

CLINICAL DATA: Bilateral flank pain and nausea.

CT ABDOMEN AND PELVIS WITHOUT CONTRAST
TECHNIQUE: Multidetector CT imaging of the abdomen and pelvis was
performed following the standard protocol without intravenous
contrast.

[Series 2: renal stone < 200 lbs 5.0 b31f · axial · 0.87mm/px · z∈[-504,-54]mm · 13 of 100 slices shown, 15 images]
[im 5/100  soft-tissue]
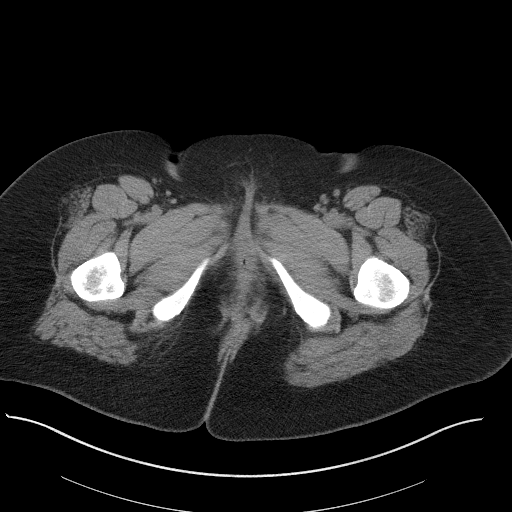
[im 5/100  bone]
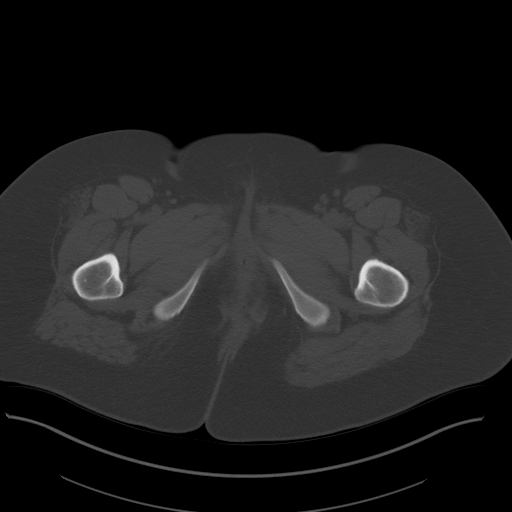
[im 13/100  soft-tissue]
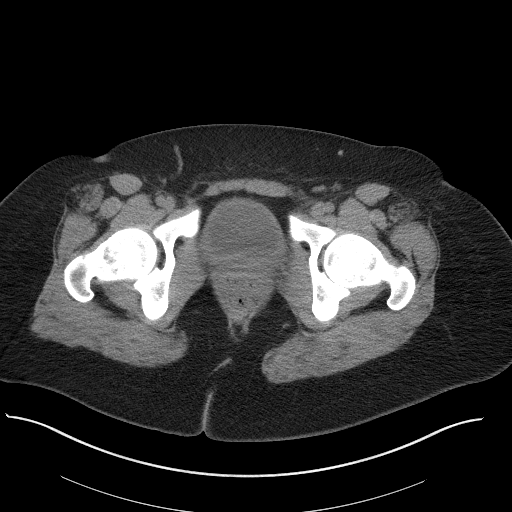
[im 21/100  soft-tissue]
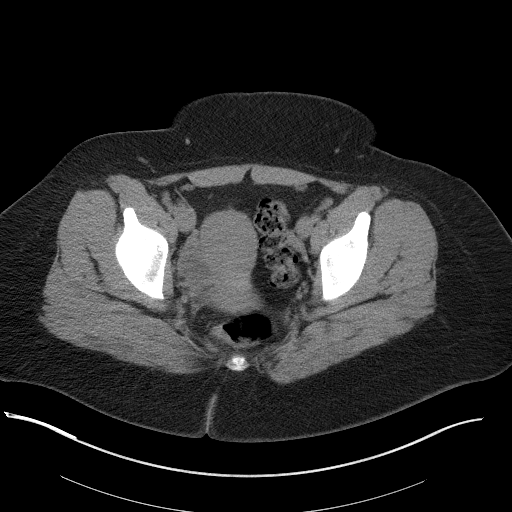
[im 29/100  soft-tissue]
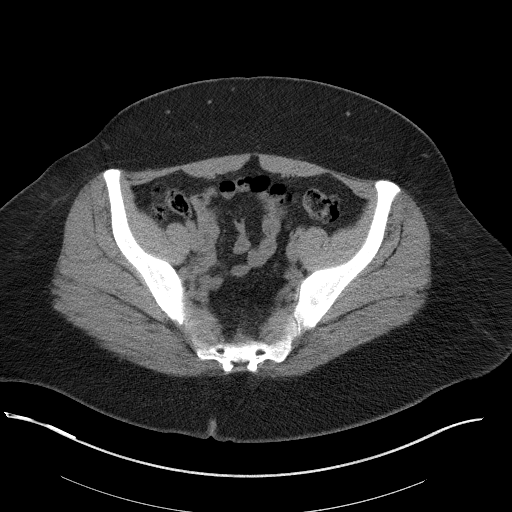
[im 34/100  soft-tissue]
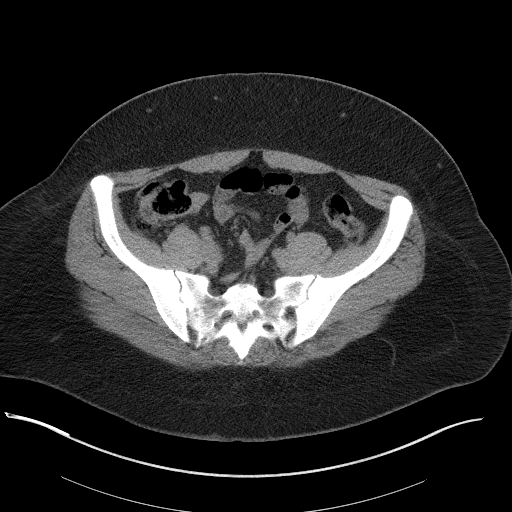
[im 42/100  soft-tissue]
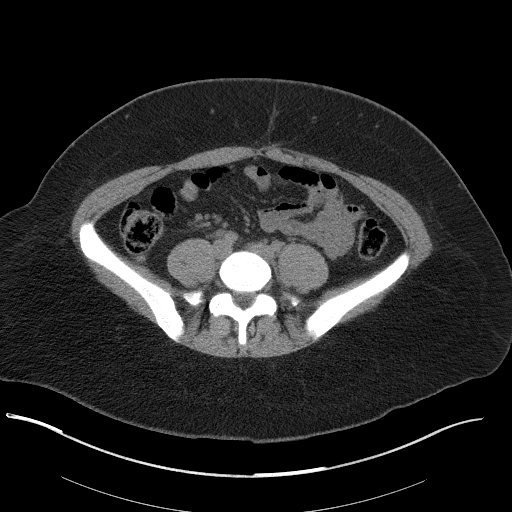
[im 50/100  soft-tissue]
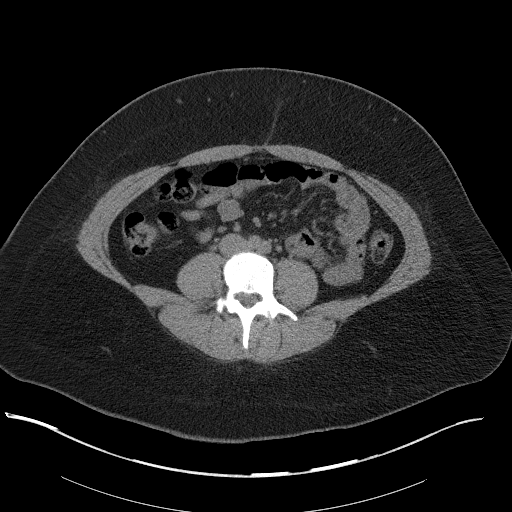
[im 58/100  soft-tissue]
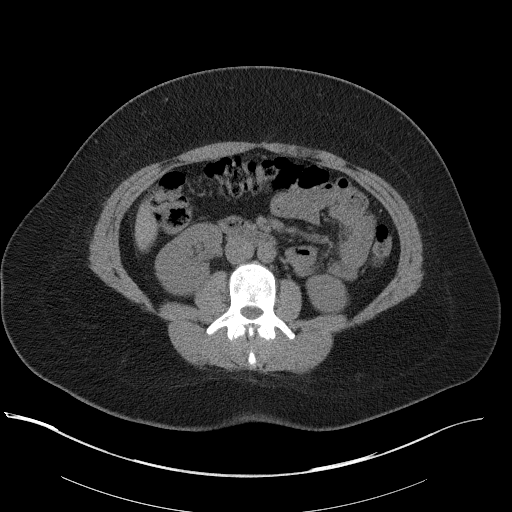
[im 67/100  soft-tissue]
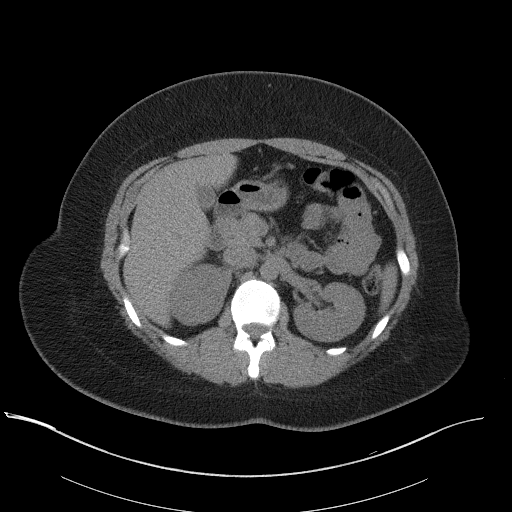
[im 67/100  bone]
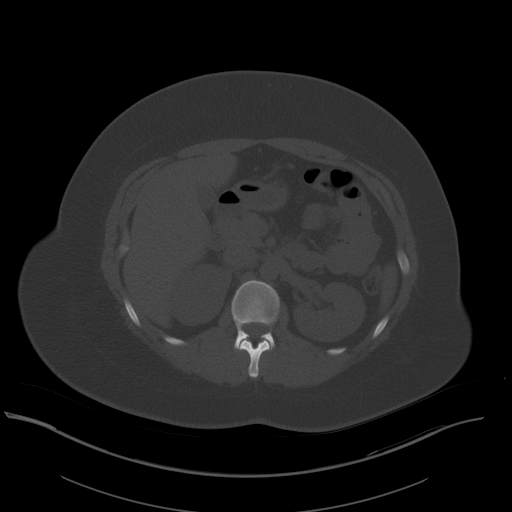
[im 71/100  soft-tissue]
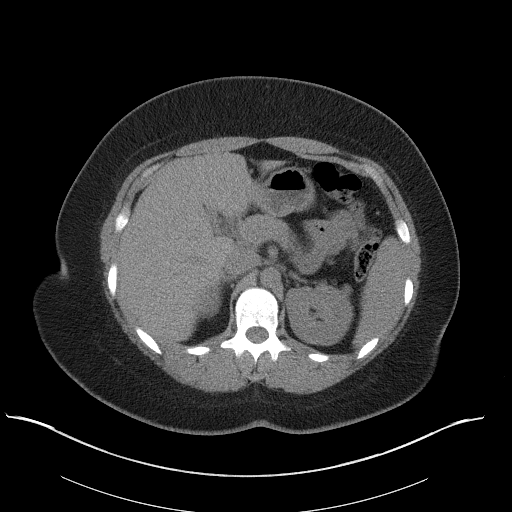
[im 79/100  soft-tissue]
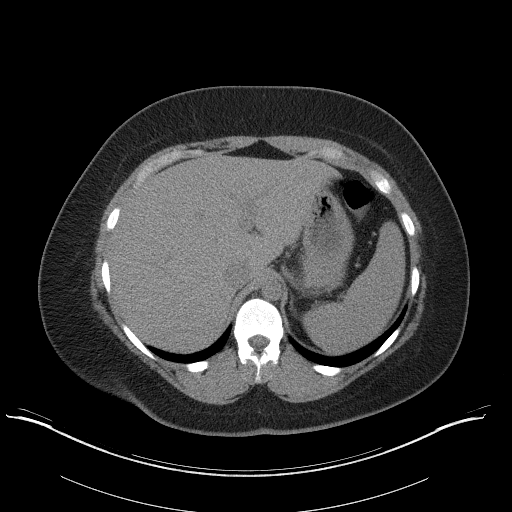
[im 87/100  soft-tissue]
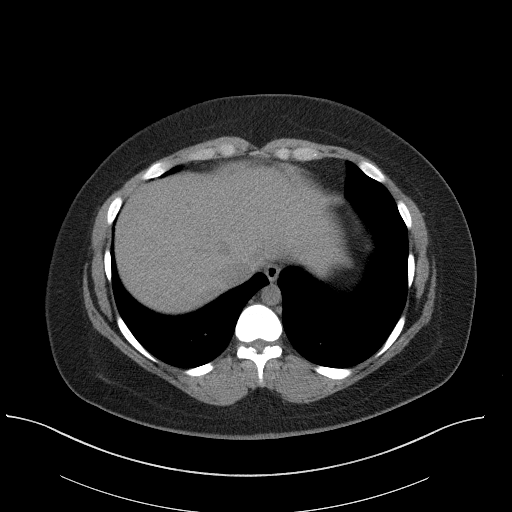
[im 95/100  soft-tissue]
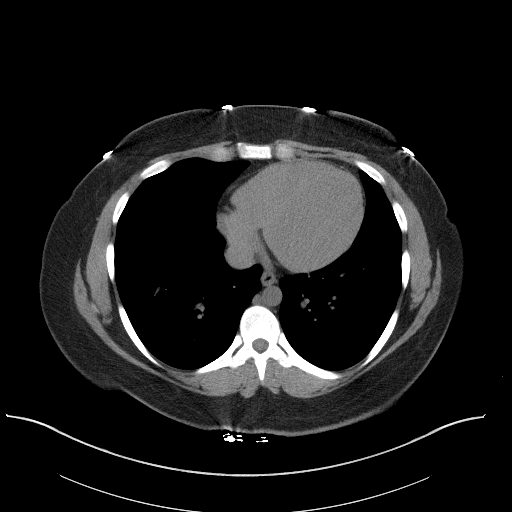

[Series 5: renal stone 3.0 coronal · coronal · 0.77mm/px · 3 of 101 slices shown]
[im 34/101  soft-tissue]
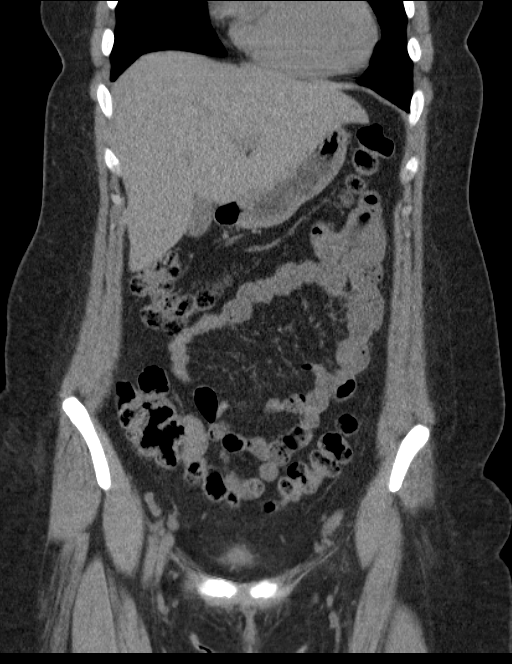
[im 45/101  soft-tissue]
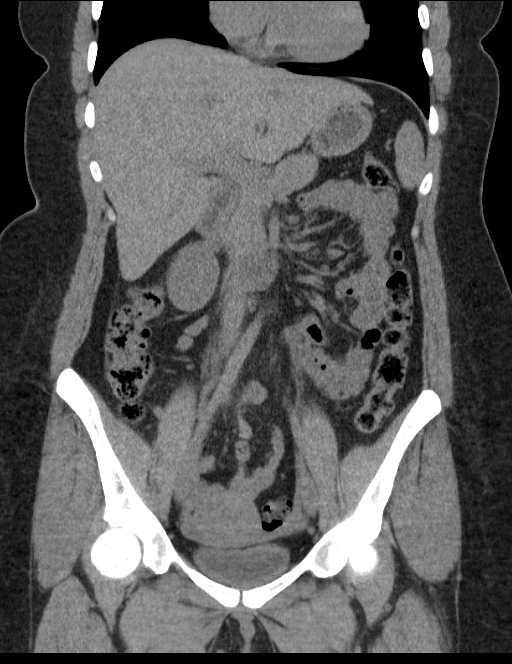
[im 56/101  soft-tissue]
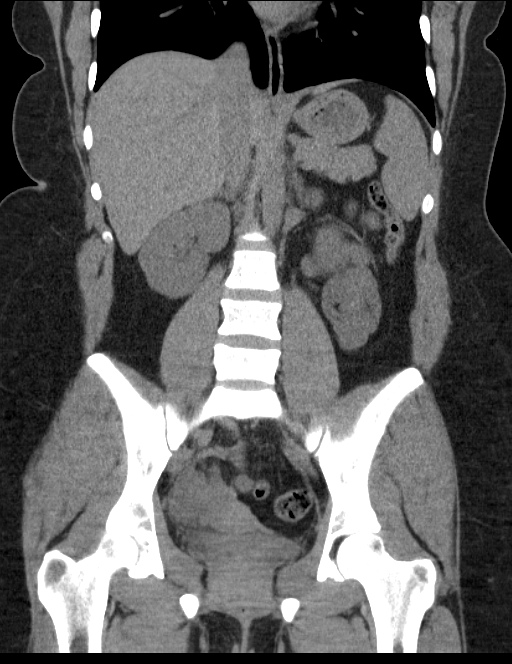

[16 of 46 positions shown; findings below may reference images not displayed]

FINDINGS: No focal abnormalities seen in the liver or spleen on
this study performed without intravenous contrast material. The
stomach, duodenum, pancreas, gallbladder, adrenal glands, and
kidneys have normal imaging features.  Specifically, there is no
evidence for renal stones.  No secondary changes in either kidney.
No ureteral or bladder stones.

No abdominal aortic aneurysm.  There is no free fluid or
lymphadenopathy in the abdomen.  The abdominal bowel loops are
unremarkable.

Imaging through the pelvis shows no free intraperitoneal fluid.
There is no pelvic sidewall lymphadenopathy.  Uterus is
unremarkable.  No adnexal mass. 2.4 cm follicle noted in the right
ovary.

No substantial diverticular change in the colon.  The terminal
ileum is normal.  The appendix is normal.

Bone windows reveal no worrisome lytic or sclerotic osseous
lesions.
IMPRESSION: No acute findings in the abdomen or pelvis.

## 2013-03-22 ENCOUNTER — Other Ambulatory Visit: Payer: Self-pay

## 2013-06-11 ENCOUNTER — Emergency Department (HOSPITAL_BASED_OUTPATIENT_CLINIC_OR_DEPARTMENT_OTHER)
Admission: EM | Admit: 2013-06-11 | Discharge: 2013-06-11 | Disposition: A | Discharge status: Home / Self Care | Payer: Managed Care, Other (non HMO) | Attending: Emergency Medicine | Admitting: Emergency Medicine

## 2013-06-11 ENCOUNTER — Encounter (HOSPITAL_BASED_OUTPATIENT_CLINIC_OR_DEPARTMENT_OTHER): Payer: Self-pay | Admitting: Emergency Medicine

## 2013-06-11 DIAGNOSIS — Z3202 Encounter for pregnancy test, result negative: Secondary | ICD-10-CM | POA: Insufficient documentation

## 2013-06-11 DIAGNOSIS — Z8719 Personal history of other diseases of the digestive system: Secondary | ICD-10-CM | POA: Insufficient documentation

## 2013-06-11 DIAGNOSIS — N760 Acute vaginitis: Secondary | ICD-10-CM | POA: Insufficient documentation

## 2013-06-11 DIAGNOSIS — B9689 Other specified bacterial agents as the cause of diseases classified elsewhere: Secondary | ICD-10-CM | POA: Insufficient documentation

## 2013-06-11 DIAGNOSIS — Z8679 Personal history of other diseases of the circulatory system: Secondary | ICD-10-CM | POA: Insufficient documentation

## 2013-06-11 DIAGNOSIS — Z79899 Other long term (current) drug therapy: Secondary | ICD-10-CM | POA: Insufficient documentation

## 2013-06-11 DIAGNOSIS — Z8659 Personal history of other mental and behavioral disorders: Secondary | ICD-10-CM | POA: Insufficient documentation

## 2013-06-11 DIAGNOSIS — A499 Bacterial infection, unspecified: Secondary | ICD-10-CM | POA: Insufficient documentation

## 2013-06-11 LAB — URINALYSIS, ROUTINE W REFLEX MICROSCOPIC
Bilirubin Urine: NEGATIVE
Glucose, UA: NEGATIVE mg/dL
Hgb urine dipstick: NEGATIVE
KETONES UR: NEGATIVE mg/dL
LEUKOCYTES UA: NEGATIVE
Nitrite: NEGATIVE
PROTEIN: NEGATIVE mg/dL
Specific Gravity, Urine: 1.021 (ref 1.005–1.030)
UROBILINOGEN UA: 0.2 mg/dL (ref 0.0–1.0)
pH: 7 (ref 5.0–8.0)

## 2013-06-11 LAB — WET PREP, GENITAL
Trich, Wet Prep: NONE SEEN
Yeast Wet Prep HPF POC: NONE SEEN

## 2013-06-11 LAB — PREGNANCY, URINE: Preg Test, Ur: NEGATIVE

## 2013-06-11 MED ORDER — METRONIDAZOLE 500 MG PO TABS: 500.0000 mg | ORAL_TABLET | Freq: Two times a day (BID) | ORAL | Status: DC

## 2013-06-11 NOTE — ED Notes (Signed)
Pt c/o vaginal discharge x 3 weeks  HX BV

## 2013-06-11 NOTE — ED Notes (Signed)
Physician asked pt about her sx when he came into the room and the pt cursed with every question. MD asked pt why she was cursing at him and She stated she was frustrated at having to answer the same questions again and at the long wait.  MD apologized to pt about the wait.

## 2013-06-11 NOTE — Discharge Instructions (Signed)

## 2013-06-11 NOTE — ED Provider Notes (Signed)
CSN: [id number]     Arrival date & time 06/11/13  1509 History  This chart was scribed for Glynn Octave, MD by Leone Payor, ED Scribe. This patient was seen in room MH01/MH01 and the patient's care was started 5:50 PM.    Chief Complaint  Patient presents with  . Vaginal Discharge    The history is provided by the patient. No language interpreter was used.    HPI Comments: Wanda Gill is a 23 y.o. female with past medical history of bacterial vaginosis who presents to the Emergency Department complaining of 3 weeks of gradual onset, constant, gradually worsening vaginal discharge. She describes the discharge as white and thick. She is unsure when her last menstrual period was because she has an Implanon. She denies dysuria, hematuria, fever, vomiting, abdominal pain. She is sexually active with 1 partner.    Past Medical History  Diagnosis Date  . Anxiety   . IBS (irritable bowel syndrome)   . Migraine    History reviewed. No pertinent past surgical history. Family History  Problem Relation Age of Onset  . Depression Father   . Diabetes Father   . Cancer Neg Hx   . Alcohol abuse Neg Hx   . Early death Neg Hx   . Heart disease Neg Hx   . Hyperlipidemia Neg Hx   . Hypertension Neg Hx   . Kidney disease Neg Hx   . Stroke Neg Hx    History  Substance Use Topics  . Smoking status: Never Smoker   . Smokeless tobacco: Never Used  . Alcohol Use: No   OB History   Grav Para Term Preterm Abortions TAB SAB Ect Mult Living                 Review of Systems A complete 10 system review of systems was obtained and all systems are negative except as noted in the HPI and PMH.   Allergies  Erythromycin  Home Medications   Current Outpatient Rx  Name  Route  Sig  Dispense  Refill  . etonogestrel (IMPLANON) 68 MG IMPL implant   Subcutaneous   Inject 1 each (68 mg total) into the skin once.   1 each   0   . metroNIDAZOLE (FLAGYL) 500 MG tablet   Oral   Take 1 tablet (500  mg total) by mouth 2 (two) times daily.   14 tablet   0    BP 145/91  Pulse 77  Temp(Src) 98.2 F (36.8 C) (Oral)  Resp 16  Ht 5\' 7"  (1.702 m)  Wt 288 lb (130.636 kg)  BMI 45.10 kg/m2  SpO2 100% Physical Exam  Nursing note and vitals reviewed. Constitutional: She is oriented to person, place, and time. She appears well-developed and well-nourished.  HENT:  Head: Normocephalic and atraumatic.  Cardiovascular: Normal rate.   Pulmonary/Chest: Effort normal.  Abdominal: Soft. She exhibits no distension and no mass. There is no tenderness. There is no rebound and no guarding.  Genitourinary: Uterus normal. Cervix exhibits no motion tenderness. Right adnexum displays no tenderness. Left adnexum displays no tenderness. Vaginal discharge found.  White discharge. No CMT or adnexal tenderness.   Neurological: She is alert and oriented to person, place, and time.  Skin: Skin is warm and dry.  Psychiatric: She has a normal mood and affect.   ED Course  Procedures (including critical care time)  DIAGNOSTIC STUDIES: Oxygen Saturation is 100% on RA, normal by my interpretation.    COORDINATION OF CARE:  6:30 PM Discussed treatment plan with pt at bedside and pt agreed to plan.   Labs Review Labs Reviewed  WET PREP, GENITAL - Abnormal; Notable for the following:    Clue Cells Wet Prep HPF POC FEW (*)    WBC, Wet Prep HPF POC MODERATE (*)    All other components within normal limits  GC/CHLAMYDIA PROBE AMP  URINALYSIS, ROUTINE W REFLEX MICROSCOPIC  PREGNANCY, URINE   Imaging Review No results found.  EKG Interpretation   None       MDM   1. Bacterial vaginosis    3 weeks of white and yellow vaginal discharge. No abdominal pain. No dysuria or hematuria. No fevers or vomiting.  Abdomen soft and nontender. Pelvic exam without tenderness.  UA negative, HCG negative. Will treat bacterial vaginosis.  I personally performed the services described in this documentation, which  was scribed in my presence. The recorded information has been reviewed and is accurate.   Glynn OctaveStephen Tanashia Ciesla, MD 06/11/13 [phone]

## 2013-06-12 LAB — GC/CHLAMYDIA PROBE AMP
CT Probe RNA: NEGATIVE
GC PROBE AMP APTIMA: NEGATIVE

## 2017-04-12 ENCOUNTER — Other Ambulatory Visit: Payer: Self-pay

## 2017-04-12 ENCOUNTER — Encounter (HOSPITAL_BASED_OUTPATIENT_CLINIC_OR_DEPARTMENT_OTHER): Payer: Self-pay | Admitting: Respiratory Therapy

## 2017-04-12 ENCOUNTER — Emergency Department (HOSPITAL_BASED_OUTPATIENT_CLINIC_OR_DEPARTMENT_OTHER)
Admission: EM | Admit: 2017-04-12 | Discharge: 2017-04-13 | Disposition: A | Discharge status: Home / Self Care | Payer: 59 | Attending: Emergency Medicine | Admitting: Emergency Medicine

## 2017-04-12 ENCOUNTER — Emergency Department (HOSPITAL_BASED_OUTPATIENT_CLINIC_OR_DEPARTMENT_OTHER): Payer: 59

## 2017-04-12 DIAGNOSIS — R062 Wheezing: Secondary | ICD-10-CM | POA: Diagnosis not present

## 2017-04-12 DIAGNOSIS — R05 Cough: Secondary | ICD-10-CM

## 2017-04-12 DIAGNOSIS — R0789 Other chest pain: Secondary | ICD-10-CM

## 2017-04-12 DIAGNOSIS — R059 Cough, unspecified: Secondary | ICD-10-CM

## 2017-04-12 NOTE — ED Provider Notes (Signed)
[facility] EMERGENCY DEPARTMENT Provider Note   CSN: [id number] Arrival date & time: 04/12/17  2129     History   Chief Complaint Chief Complaint  Patient presents with  . Cough    HPI Wanda Gill is a 26 y.o. female who presents to the emergency department with a chief complaint of dyspnea and productive cough with green/yellow sputum times 1 month.  She reports that [facility] about 1 week after onset of symptoms and was discharged with an albuterol inhaler and a 5-day course of prednisone.  She reports no improvement with the prednisone, but states that the albuterol inhaler initially helped her symptoms, but has gradually become less effective.  She denies fever, chills, postnasal drip, sore throat, otalgia, rash, chest pain, lower extremity edema, palpitations, or back pain.  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   No other treatment prior to arrival.  She is a non-smoker.  No history of asthma or COPD.  The history is provided by the patient. No language interpreter was used.    Past Medical History:  Diagnosis Date  . Anxiety   . Migraine     Patient Active Problem List   Diagnosis Date Noted  . Routine general medical examination at a health care facility 06/23/2012  . Obesity, Class III, BMI 40-49.9 (morbid obesity) (HCC) 06/23/2012    History reviewed. No pertinent surgical history.  OB History    No data available       Home Medications    Prior to Admission medications   Medication Sig Start Date End Date Taking? Authorizing Provider  promethazine-dextromethorphan (PROMETHAZINE-DM) 6.25-15 MG/5ML syrup Take 5 mLs by mouth 4 (four) times daily as needed for cough. 04/13/17   Tawny Raspberry, Coral ElseMia A, PA-C    Family History Family History  Problem Relation Age of Onset  . Depression Father   . Diabetes Father     . Cancer Neg Hx   . Alcohol abuse Neg Hx   . Early death Neg Hx   . Heart disease Neg Hx   . Hyperlipidemia Neg Hx   . Hypertension Neg Hx   . Kidney disease Neg Hx   . Stroke Neg Hx     Social History Social History   Tobacco Use  . Smoking status: Never Smoker  . Smokeless tobacco: Never Used  Substance Use Topics  . Alcohol use: Yes    Comment: occ  . Drug use: No     Allergies   Erythromycin   Review of Systems Review of Systems  Constitutional: Negative for activity change, chills and fever.  HENT: Negative for congestion, ear pain, hearing loss, postnasal drip, rhinorrhea, sneezing, sore throat and trouble swallowing.   Respiratory: Positive for chest tightness and shortness of breath.   Cardiovascular: Negative for chest pain, palpitations and leg swelling.  Gastrointestinal: Negative for abdominal pain, diarrhea, nausea and vomiting.  Musculoskeletal: Negative for back pain.  Skin: Negative for rash.  Allergic/Immunologic: Negative for immunocompromised state.  Neurological: Negative for headaches.     Physical Exam Updated Vital Signs BP 130/70 (BP Location: Left Arm)   Pulse 80   Temp 98.1 F (36.7 C) (Oral)   Resp 20   Ht 5\' 7"  (1.702 m)   Wt 127 kg (279 lb 15.8 oz)   SpO2 100%   BMI 43.85 kg/m  Physical Exam  Constitutional: No distress.  HENT:  Head: Normocephalic.  Right Ear: Tympanic membrane normal.  Left Ear: Tympanic membrane normal.  Nose: Mucosal edema present. Right sinus exhibits maxillary sinus tenderness. Right sinus exhibits no frontal sinus tenderness. Left sinus exhibits no maxillary sinus tenderness and no frontal sinus tenderness.  Mouth/Throat: Uvula is midline and mucous membranes are normal. No trismus in the jaw. No oropharyngeal exudate, posterior oropharyngeal edema, posterior oropharyngeal erythema or tonsillar abscesses. Tonsils are 1+ on the right. Tonsils are 0 on the left. No tonsillar exudate.  Cerumen noted in  the bilateral canals; no occlusion bilaterally.  Minimal tenderness to palpation over the right maxillary sinus.   Eyes: Conjunctivae are normal.  Neck: Neck supple.  Cardiovascular: Normal rate, regular rhythm, normal heart sounds and intact distal pulses. Exam reveals no gallop and no friction rub.  No murmur heard. Pulmonary/Chest: Effort normal. No stridor. No respiratory distress. She has wheezes. She has no rales.  Bilateral expiratory wheezes noted in all fields.  No inspiratory wheezes noted.  Abdominal: Soft. She exhibits no distension.  Musculoskeletal: She exhibits no edema.  Neurological: She is alert.  Skin: Skin is warm. No rash noted.  Psychiatric: Her behavior is normal.  Nursing note and vitals reviewed.    ED Treatments / Results  Labs (all labs ordered are listed, but only abnormal results are displayed) Labs Reviewed - No data to display  EKG  EKG Interpretation None       Radiology Dg Chest 2 View  Result Date: 04/13/2017 CLINICAL DATA:  Productive cough for 1 month and shortness of breath. EXAM: CHEST  2 VIEW COMPARISON:  None. FINDINGS: The cardiomediastinal contours are normal. The lungs are clear. Pulmonary vasculature is normal. No consolidation, pleural effusion, or pneumothorax. No acute osseous abnormalities are seen. IMPRESSION: No acute pulmonary process. Electronically Signed   By: Rubye Oaks M.D.   On: 04/13/2017 00:10    Procedures Procedures (including critical care time)  Medications Ordered in ED Medications  albuterol (PROVENTIL HFA;VENTOLIN HFA) 108 (90 Base) MCG/ACT inhaler 4 puff (4 puffs Inhalation Given 04/13/17 0039)     Initial Impression / Assessment and Plan / ED Course  I have reviewed the triage vital signs and the nursing notes.  Pertinent labs & imaging results that were available during my care of the patient were reviewed by me and considered in my medical decision making (see chart for details).      26 year old female presenting with productive cough and dyspnea times 1 month.  She is treated her symptoms at home with an albuterol inhaler without improvement.  She was given a burst of steroids about 1 week after the symptoms began, which she completed without improvement.  On physical exam, the patient has mild expiratory wheezes bilaterally in all fields.  HEENT exam is mostly unremarkable.  No constitutional symptoms.  Chest x-ray does not demonstrate a consolidation, pleural effusion, or other acute pulmonary processes.  Respiratory educated the patient and provided her with a new albuterol inhaler and spacer.  She reports significant improvement in her symptoms after albuterol.  At this time, doubt pneumonia, acute CHF, swine influenza, influenza A or B, or occult pneumonia.  We will discharge the patient with albuterol inhaler and spacer and encouraged the patient to follow-up with primary care for reevaluation. The patient is safe for discharge at this time.  Final Clinical Impressions(s) / ED Diagnoses   Final diagnoses:  Cough  Wheezing  Chest tightness  ED Discharge Orders        Ordered    promethazine-dextromethorphan (PROMETHAZINE-DM) 6.25-15 MG/5ML syrup  4 times daily PRN     04/13/17 0054       Frederik PearMcDonald, Peyten Punches A, PA-C 04/13/17 0156    Molpus, Jonny RuizJohn, MD 04/13/17 [phone]

## 2017-04-12 NOTE — ED Notes (Signed)
ED Provider at bedside. 

## 2017-04-12 NOTE — ED Triage Notes (Signed)
C/o prod cough, SOB x 1 month-NAD-steady gait

## 2017-04-13 MED ORDER — ALBUTEROL SULFATE HFA 108 (90 BASE) MCG/ACT IN AERS: 2.0000 | INHALATION_SPRAY | Freq: Once | RESPIRATORY_TRACT | Status: DC

## 2017-04-13 MED ORDER — ALBUTEROL SULFATE HFA 108 (90 BASE) MCG/ACT IN AERS
4.0000 | INHALATION_SPRAY | Freq: Once | RESPIRATORY_TRACT | Status: AC
  Administered 2017-04-13: 4 via RESPIRATORY_TRACT
  Filled 2017-04-13: qty 6.7

## 2017-04-13 MED ORDER — PROMETHAZINE-DM 6.25-15 MG/5ML PO SYRP: 5.0000 mL | ORAL_SOLUTION | Freq: Four times a day (QID) | ORAL | 0 refills | Status: AC | PRN

## 2017-04-13 MED ORDER — IPRATROPIUM-ALBUTEROL 0.5-2.5 (3) MG/3ML IN SOLN: 3.0000 mL | Freq: Four times a day (QID) | RESPIRATORY_TRACT | Status: DC

## 2017-04-13 NOTE — Discharge Instructions (Signed)
Take 2 puffs of the albuterol inhaler with a spacer every 4 hours as needed for cough and shortness of breath. Take 5 mLs of promethazine-DM every 6 hours as needed for cough. Use caution with this medication if you have to work or drive because it can make you drowsy.   Please call the number on your discharge paperwork to get established with primary care, particularly if you are needing to use your inhaler daily.  If you have new or worsening symptoms, including fever, back pain, or increased work of breathing, please return to the emergency department for reevaluation.

## 2018-04-16 IMAGING — DX DG CHEST 2V
2 series · 2 of 2 positions shown · non-contrast
Comparison: None.

CLINICAL DATA: Productive cough for 1 month and shortness of
breath.

EXAM:
CHEST  2 VIEW

[chest pa]
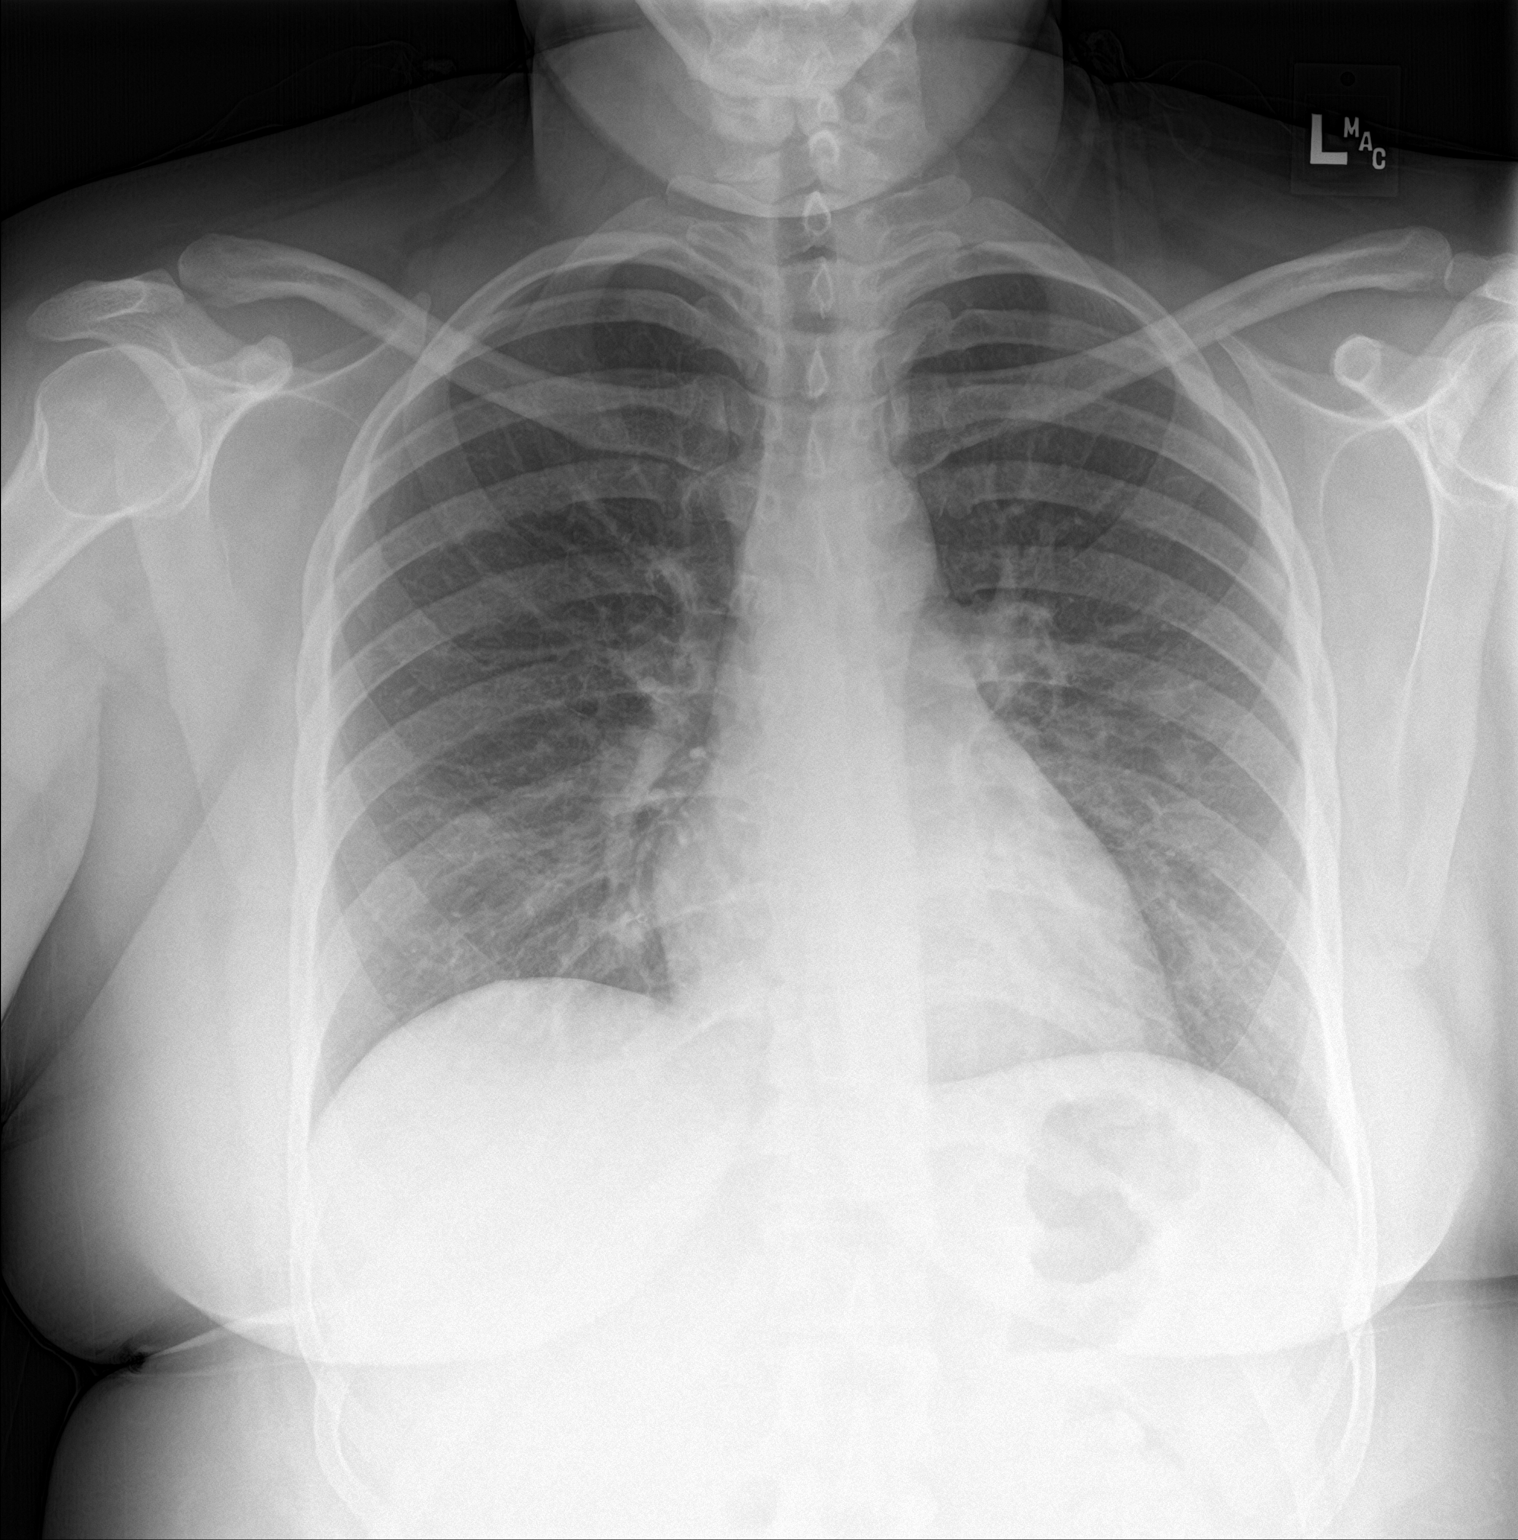

[chest lat]
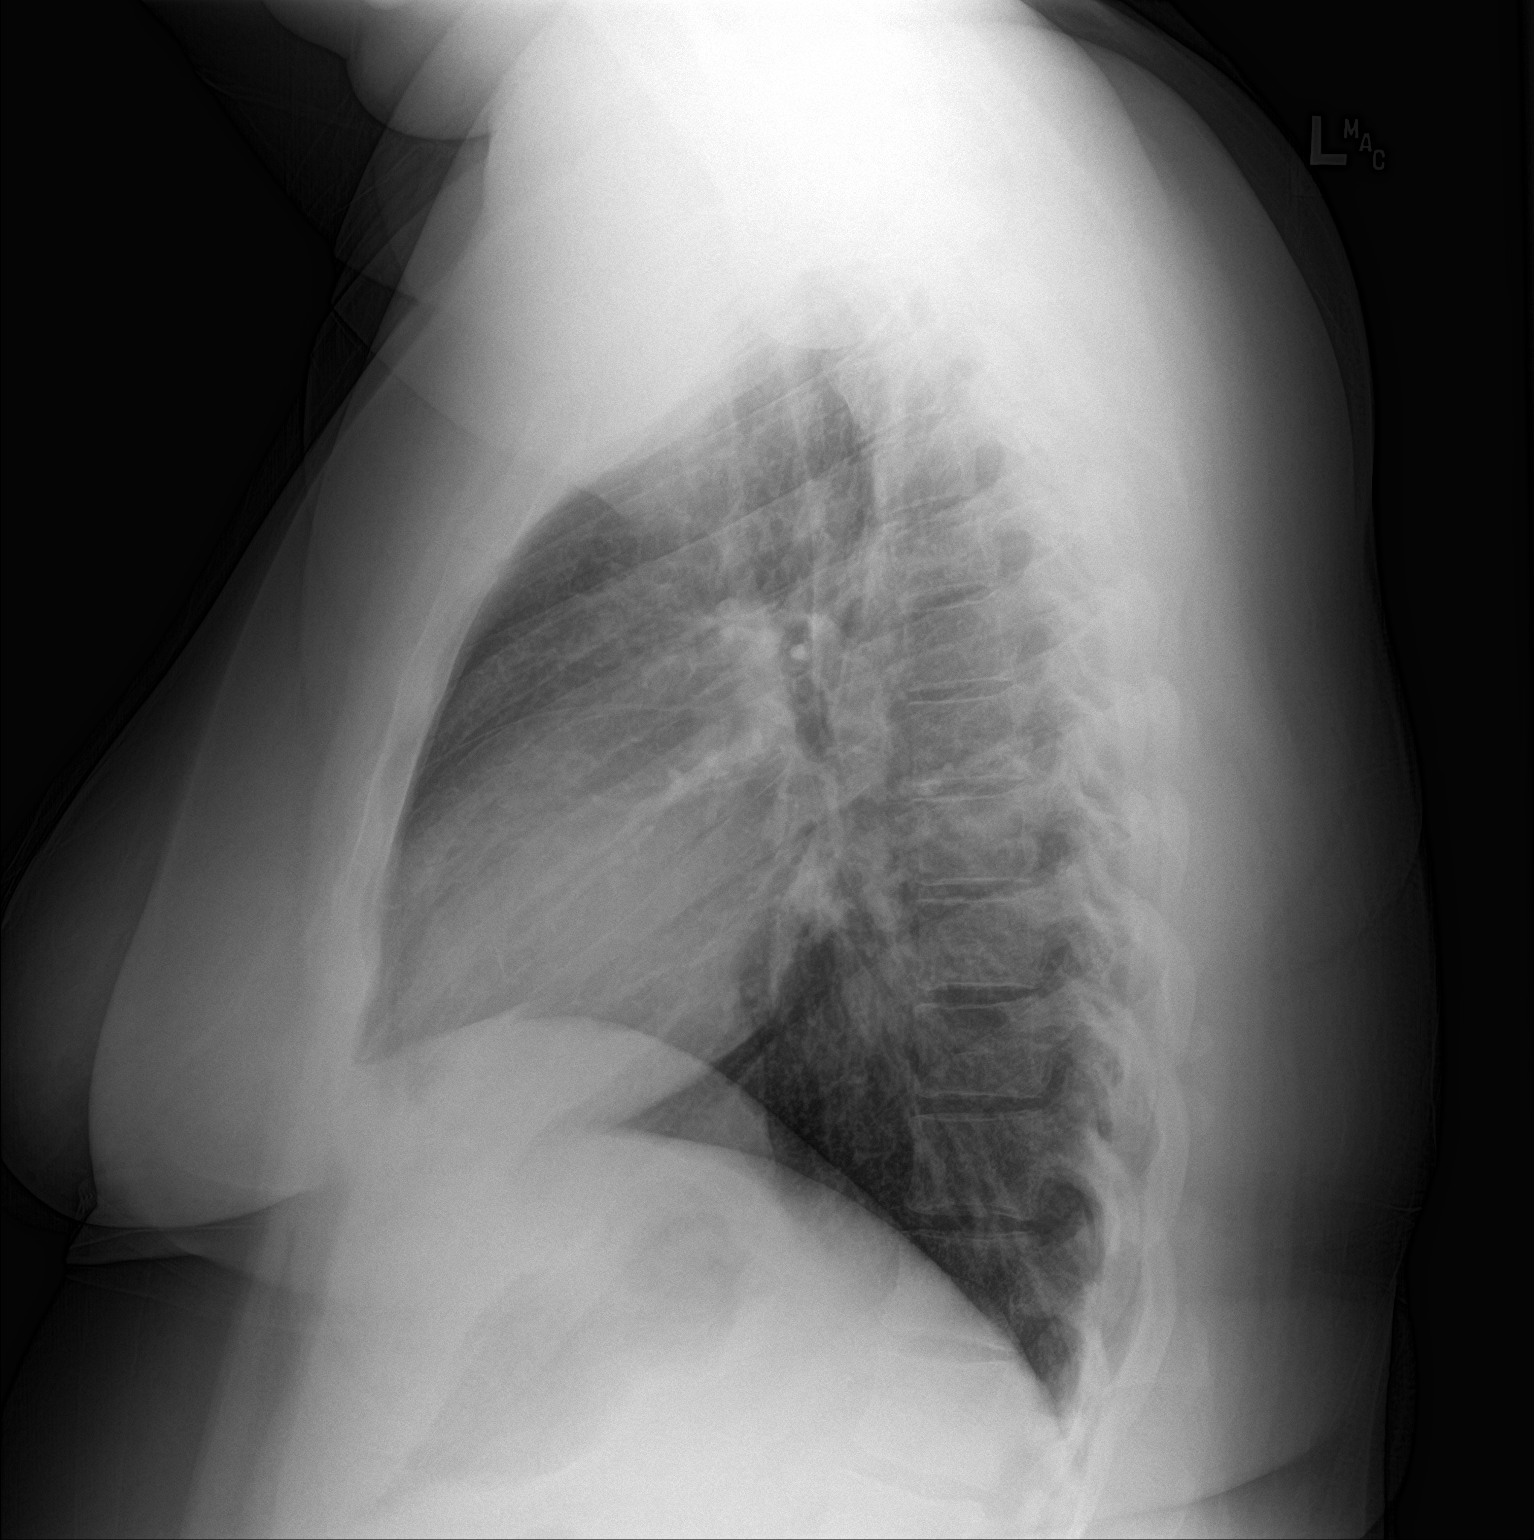

[2 of 2 positions shown; findings below may reference images not displayed]

FINDINGS: The cardiomediastinal contours are normal. The lungs are clear.
Pulmonary vasculature is normal. No consolidation, pleural effusion,
or pneumothorax. No acute osseous abnormalities are seen.
IMPRESSION: No acute pulmonary process.
